# Patient Record
Sex: Male | Born: 1985 | Race: White | Hispanic: No | Marital: Single
Health system: Southern US, Community
[De-identification: ages and names within clinical notes are randomized; demographics above are authoritative.]

---
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
## 2006-02-17 IMAGING — CR DG FOOT COMPLETE 3+V*L*
3 series · 3 of 3 positions shown · non-contrast
Comparison: None.

CLINICAL DATA: Stepped on nail yesterday. Wound in heel pad.

LEFT FOOT - 3 VIEW  04/25/2004:

[view not recorded (1 of 3)]
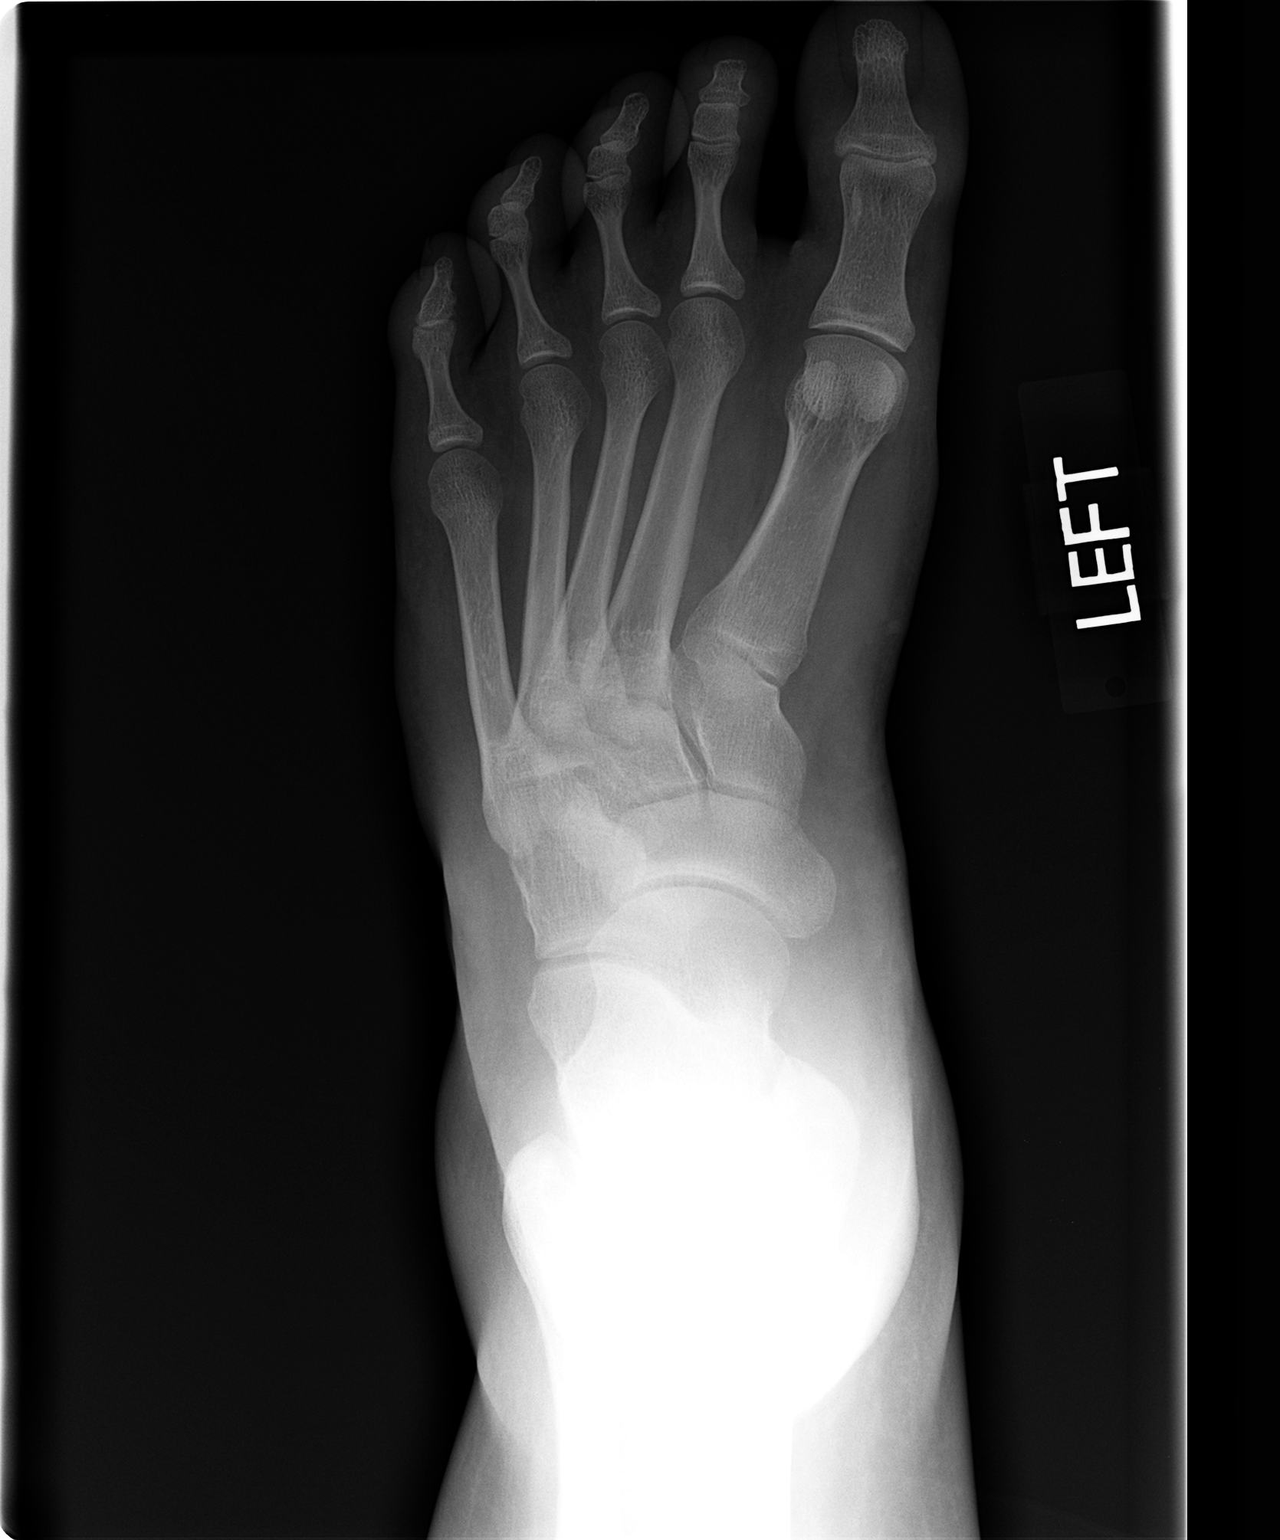

[view not recorded (2 of 3)]
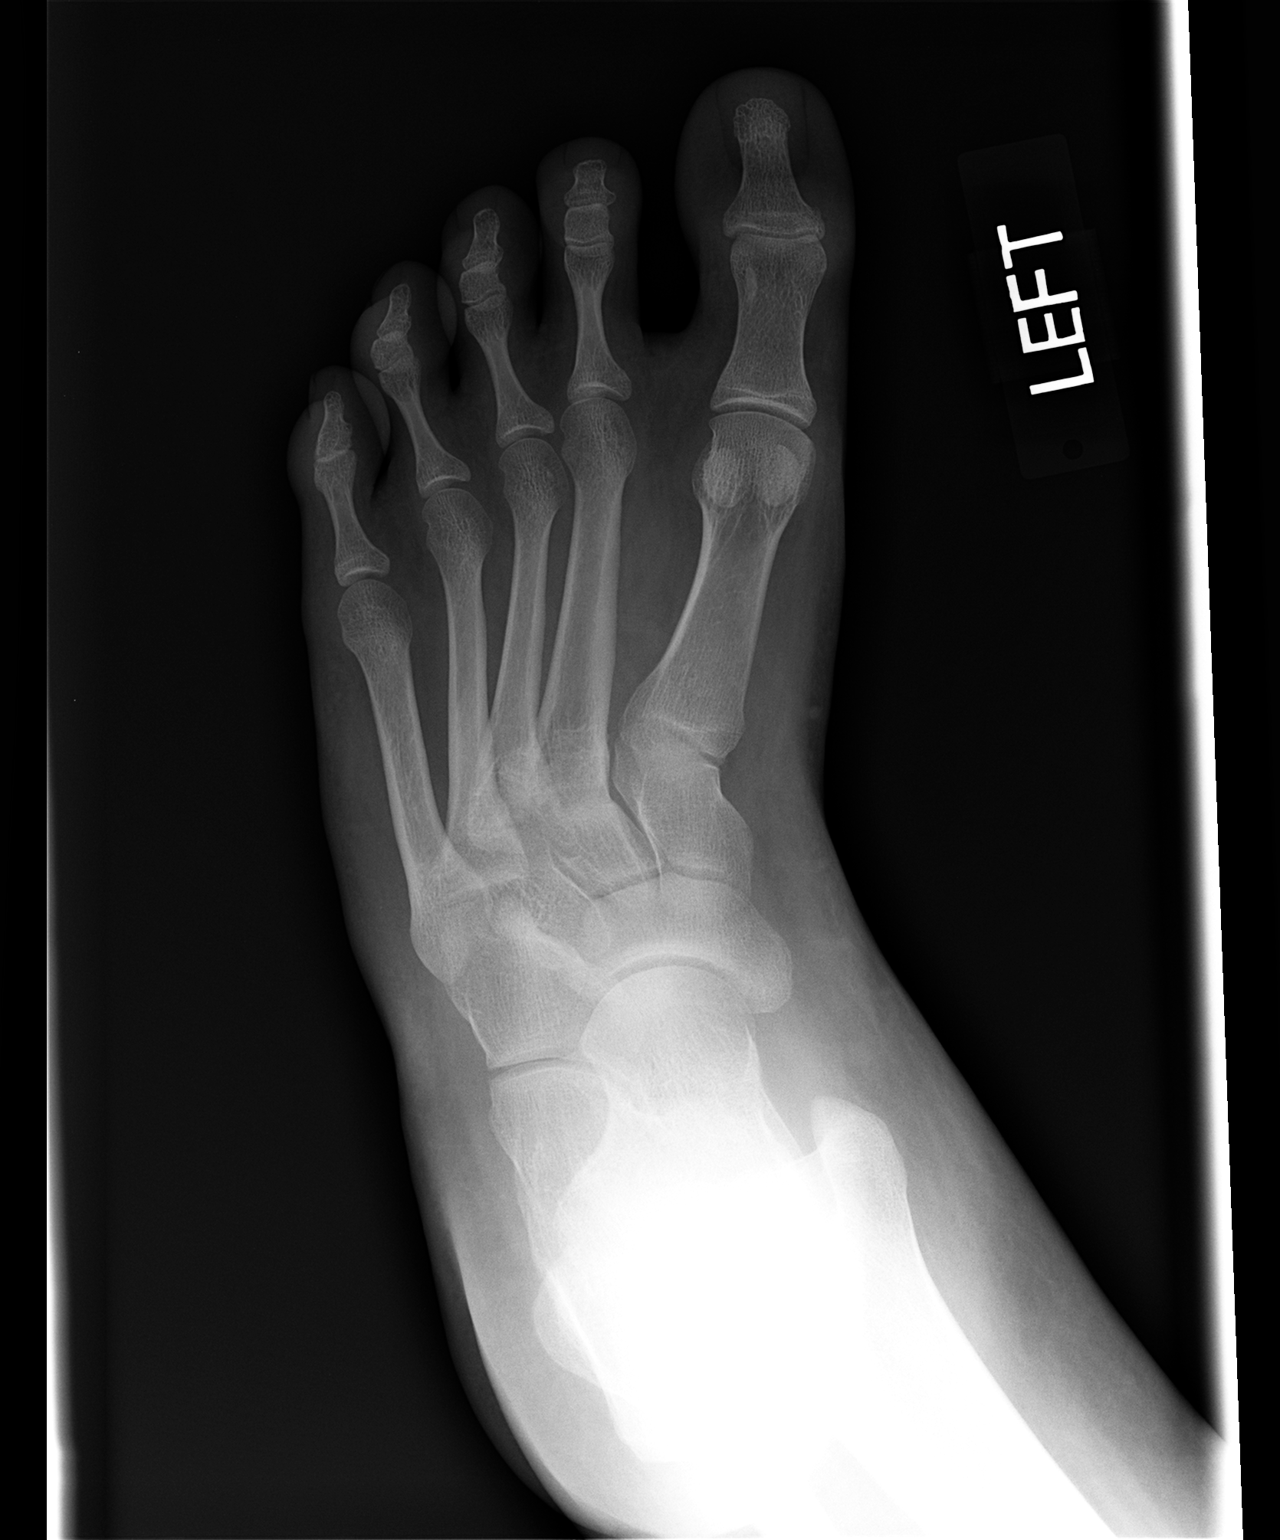

[view not recorded (3 of 3)]
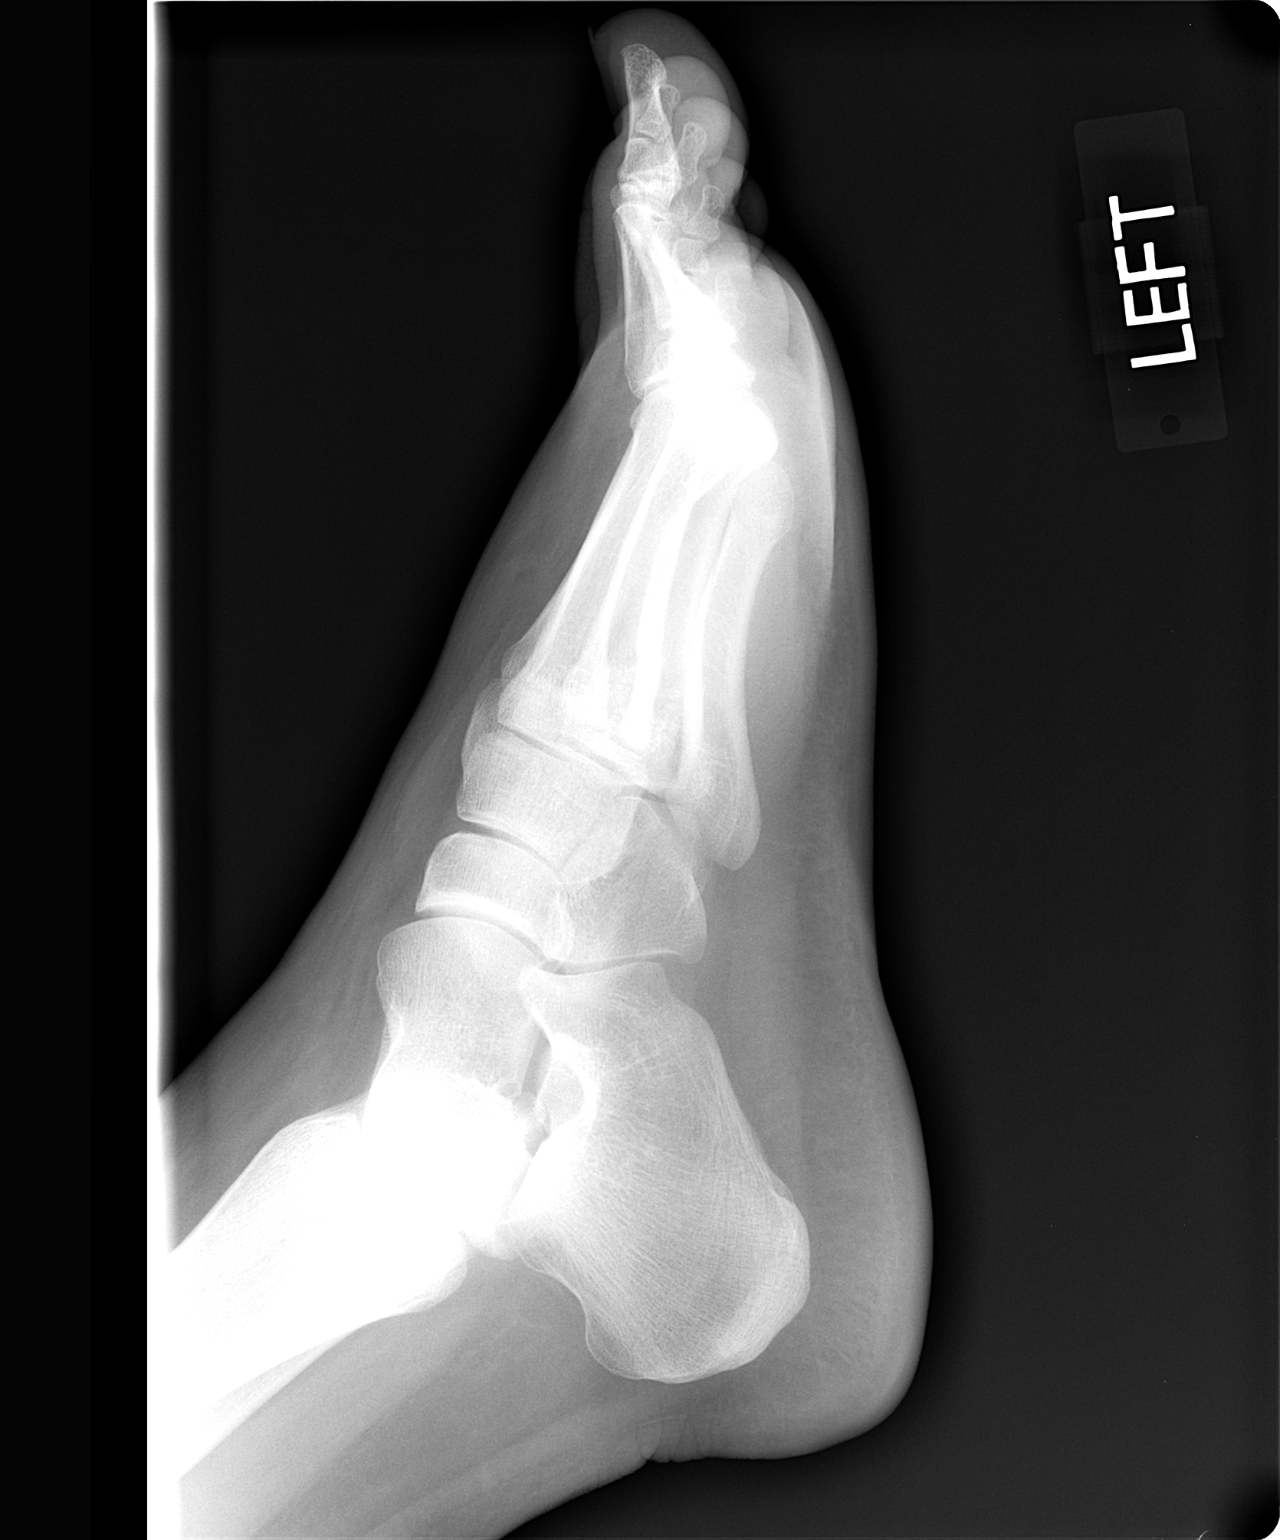

[3 of 3 positions shown; findings below may reference images not displayed]

FINDINGS: There is no evidence of an acute or subacute fracture or dislocation.
Joint spaces are normal. There are no intrinsic osseous abnormalities. There is
no evidence for osteomyelitis. Soft tissue edema is present in the heel pad.
There is no subcutaneous air.
IMPRESSION: No skeletal abnormality.

## 2015-04-09 DIAGNOSIS — Z139 Encounter for screening, unspecified: Secondary | ICD-10-CM

## 2015-10-10 NOTE — Congregational Nurse Program (Signed)
Congregational Nurse Program Note  Date of Encounter: 04/09/2015  Past Medical History: No past medical history on file.  Encounter Details:     CNP Questionnaire - 10/10/15 1403      Patient Demographics   Is this a new or existing patient? New   Patient is considered a/an Not Applicable   Race Caucasian/White     Patient Assistance   Location of Patient [place]   Patient's financial/insurance status Self-Pay   Uninsured Patient Yes   Interventions Assisted patient in making appt.   Food insecurities addressed Provided food supplies   Transportation assistance No   Assistance securing medications No   Educational health offerings Navigating the healthcare system     Encounter Details   Primary purpose of visit Acute Illness/Condition Visit   Does patient have a medical provider? No   [facility] referred to [facility]   Was a mental health screening completed? (GAINS tool) No   Does patient have dental issues? Yes   Was a dental referral made? Yes   Does patient have vision issues? No   Does your patient have an abnormal blood pressure today? No   Since previous encounter, have you referred patient for abnormal blood pressure that resulted in a new diagnosis or medication change? No   Does your patient have an abnormal blood glucose today? No   Since previous encounter, have you referred patient for abnormal blood glucose that resulted in a new diagnosis or medication change? No   Was there a life-saving intervention made? Yes      Client was referred to [facility] for appointment with a dentist. Pearletha Alfred [facility]

## 2015-10-27 NOTE — Congregational Nurse Program (Signed)
Congregational Nurse Program Note  Date of Encounter: 04/09/2015  Past Medical History: No past medical history on file.  Encounter Details:     CNP Questionnaire - 10/10/15 1403      Patient Demographics   Is this a new or existing patient? New   Patient is considered a/an Not Applicable   Race Caucasian/White     Patient Assistance   Location of Patient [place]   Patient's financial/insurance status Self-Pay   Uninsured Patient Yes   Interventions Assisted patient in making appt.   Food insecurities addressed Provided food supplies   Transportation assistance No   Assistance securing medications No   Educational health offerings Navigating the healthcare system     Encounter Details   Primary purpose of visit Acute Illness/Condition Visit   Does patient have a medical provider? No   Patient referred to [facility]   Was a mental health screening completed? (GAINS tool) No   Does patient have dental issues? Yes   Was a dental referral made? Yes   Does patient have vision issues? No   Does your patient have an abnormal blood pressure today? No   Since previous encounter, have you referred patient for abnormal blood pressure that resulted in a new diagnosis or medication change? No   Does your patient have an abnormal blood glucose today? No   Since previous encounter, have you referred patient for abnormal blood glucose that resulted in a new diagnosis or medication change? No   Was there a life-saving intervention made? Yes      [facility] 04/[facility]/15 3:00. Client stated had been to ER for dental abscess. Pearletha AlfredJan Dantae Meunier,RN CNP [phone].
